# Patient Record
Sex: Female | Born: 1995 | Race: Black or African American | Hispanic: No | Marital: Married | Smoking: Never smoker
Health system: Southern US, Community
[De-identification: ages and names within clinical notes are randomized; demographics above are authoritative.]

## PROBLEM LIST (undated history)

## (undated) ENCOUNTER — Inpatient Hospital Stay: Payer: Self-pay

## (undated) DIAGNOSIS — F329 Major depressive disorder, single episode, unspecified: Secondary | ICD-10-CM

## (undated) DIAGNOSIS — F32A Depression, unspecified: Secondary | ICD-10-CM

## (undated) DIAGNOSIS — F419 Anxiety disorder, unspecified: Secondary | ICD-10-CM

---

## 2016-04-10 ENCOUNTER — Emergency Department
Admission: EM | Admit: 2016-04-10 | Discharge: 2016-04-10 | Disposition: A | Discharge status: Home / Self Care | Payer: Self-pay | Attending: Emergency Medicine | Admitting: Emergency Medicine

## 2016-04-10 ENCOUNTER — Emergency Department: Payer: Self-pay

## 2016-04-10 ENCOUNTER — Encounter: Payer: Self-pay | Admitting: *Deleted

## 2016-04-10 DIAGNOSIS — Z3490 Encounter for supervision of normal pregnancy, unspecified, unspecified trimester: Secondary | ICD-10-CM

## 2016-04-10 DIAGNOSIS — Z3A00 Weeks of gestation of pregnancy not specified: Secondary | ICD-10-CM | POA: Insufficient documentation

## 2016-04-10 DIAGNOSIS — O99280 Endocrine, nutritional and metabolic diseases complicating pregnancy, unspecified trimester: Secondary | ICD-10-CM | POA: Insufficient documentation

## 2016-04-10 DIAGNOSIS — O99519 Diseases of the respiratory system complicating pregnancy, unspecified trimester: Secondary | ICD-10-CM | POA: Insufficient documentation

## 2016-04-10 DIAGNOSIS — E876 Hypokalemia: Secondary | ICD-10-CM | POA: Insufficient documentation

## 2016-04-10 DIAGNOSIS — B9789 Other viral agents as the cause of diseases classified elsewhere: Secondary | ICD-10-CM

## 2016-04-10 DIAGNOSIS — J069 Acute upper respiratory infection, unspecified: Secondary | ICD-10-CM | POA: Insufficient documentation

## 2016-04-10 DIAGNOSIS — R112 Nausea with vomiting, unspecified: Secondary | ICD-10-CM

## 2016-04-10 DIAGNOSIS — O0281 Inappropriate change in quantitative human chorionic gonadotropin (hCG) in early pregnancy: Secondary | ICD-10-CM | POA: Insufficient documentation

## 2016-04-10 LAB — URINALYSIS, COMPLETE (UACMP) WITH MICROSCOPIC
Bacteria, UA: NONE SEEN
Bilirubin Urine: NEGATIVE
GLUCOSE, UA: NEGATIVE mg/dL
HGB URINE DIPSTICK: NEGATIVE
Ketones, ur: NEGATIVE mg/dL
Leukocytes, UA: NEGATIVE
NITRITE: NEGATIVE
Protein, ur: 30 mg/dL — AB
Specific Gravity, Urine: 1.02 (ref 1.005–1.030)
pH: 9 — ABNORMAL HIGH (ref 5.0–8.0)

## 2016-04-10 LAB — BASIC METABOLIC PANEL
Anion gap: 8 (ref 5–15)
BUN: 6 mg/dL (ref 6–20)
CALCIUM: 8.8 mg/dL — AB (ref 8.9–10.3)
CO2: 21 mmol/L — ABNORMAL LOW (ref 22–32)
Chloride: 105 mmol/L (ref 101–111)
Creatinine, Ser: 0.46 mg/dL (ref 0.44–1.00)
GFR calc Af Amer: >60 mL/min (ref >60)
Glucose, Bld: 102 mg/dL — ABNORMAL HIGH (ref 65–99)
POTASSIUM: 3.2 mmol/L — AB (ref 3.5–5.1)
SODIUM: 134 mmol/L — AB (ref 135–145)

## 2016-04-10 LAB — POCT PREGNANCY, URINE: Preg Test, Ur: POSITIVE — AB

## 2016-04-10 LAB — CBC
HCT: 38.3 % (ref 35.0–47.0)
Hemoglobin: 13.4 g/dL (ref 12.0–16.0)
MCH: 30.4 pg (ref 26.0–34.0)
MCHC: 35.1 g/dL (ref 32.0–36.0)
MCV: 86.5 fL (ref 80.0–100.0)
PLATELETS: 327 10*3/uL (ref 150–440)
RBC: 4.42 MIL/uL (ref 3.80–5.20)
RDW: 13.6 % (ref 11.5–14.5)
WBC: 6.6 10*3/uL (ref 3.6–11.0)

## 2016-04-10 LAB — HCG, QUANTITATIVE, PREGNANCY: hCG, Beta Chain, Quant, S: 117397 m[IU]/mL — ABNORMAL HIGH (ref <5)

## 2016-04-10 LAB — TROPONIN I: Troponin I: <0.03 ng/mL (ref <0.03)

## 2016-04-10 MED ORDER — HYDROCODONE-HOMATROPINE 5-1.5 MG/5ML PO SYRP
5.0000 mL | ORAL_SOLUTION | Freq: Four times a day (QID) | ORAL | 0 refills | Status: AC | PRN
Start: 2016-04-10 — End: ?

## 2016-04-10 MED ORDER — ONDANSETRON 4 MG PO TBDP: ORAL_TABLET | ORAL | 0 refills | Status: AC

## 2016-04-10 MED ORDER — POTASSIUM CHLORIDE CRYS ER 20 MEQ PO TBCR: 20.0000 meq | EXTENDED_RELEASE_TABLET | Freq: Every day | ORAL | 0 refills | Status: AC

## 2016-04-10 MED ORDER — POTASSIUM CHLORIDE CRYS ER 20 MEQ PO TBCR
40.0000 meq | EXTENDED_RELEASE_TABLET | Freq: Once | ORAL | Status: AC
  Administered 2016-04-10: 40 meq via ORAL
  Filled 2016-04-10: qty 2

## 2016-04-10 NOTE — Discharge Instructions (Signed)
You have been seen in the Emergency Department (ED) today for a likely viral illness.  Please drink plenty of clear fluids (water, Gatorade, chicken broth, etc).  You may use Tylenol and/or Motrin according to label instructions.  You can alternate between the two without any side effects.   Your pregnancy test was positive and shows that you are early in your pregnancy, probably between 6 and 8 weeks.  These [facility] either at [facility] or at [facility] at the next available opportunity to begin prenatal care.  If you are not already taking prenatal vitamins, please purchase some over-the-counter and start taking them according to label instructions.  Please follow up with your doctor as listed above.  Call your doctor or return to the Emergency Department (ED) if you are unable to tolerate fluids due to vomiting, have worsening trouble breathing, become extremely tired or difficult to awaken, or if you develop any other symptoms that concern you.

## 2016-04-10 NOTE — ED Provider Notes (Signed)
[facility] Emergency Department Provider Note  ____________________________________________   First MD Initiated Contact with Patient 04/10/16 1652     (approximate)  I have reviewed the triage vital signs and the nursing notes.   HISTORY  Chief Complaint Chest Pain and Abdominal Pain    HPI Misty Hudson is a 21 y.o. female with no significant past medical history who presents for evaluation of a variety of symptoms that include URI symptoms (no congestion and persistent cough for about 1 week), chest pain when coughing, and possible pregnancy with persistent nausea, intermittent abdominal cramping, and frequent vomiting.  Her last menstrual period was about 2 months ago.  She last had a Depo-Provera shot about 6 months ago.  She denies any significant abdominal pain and does have occasional cramps.  She has had persistent vomiting for more than a week and nothing particular makes it better or worse.  Regarding the upper respiratory symptoms, she was feeling better after several days of nasal congestion, runny nose, and cough, but the cough has persisted and she is coughing forcefully enough now that she has pain in her chest when she coughs.  It is generally nonproductive.    She denies fever/chills, dysuria, hematuria, lower abdominal pain, vaginal bleeding, vaginal discharge.   No past medical history on file.  There are no active problems to display for this patient.   No past surgical history on file.  Prior to Admission medications   Medication Sig Start Date End Date Taking? Authorizing Provider  HYDROcodone-homatropine (HYCODAN) 5-1.5 MG/5ML syrup Take 5 mLs by mouth every 6 (six) hours as needed for cough. 04/10/16   Loleta Roseory Amelya Mabry, MD  ondansetron (ZOFRAN ODT) 4 MG disintegrating tablet Allow 1-2 tablets to dissolve in your mouth every 8 hours as needed for nausea/vomiting 04/10/16   Loleta Roseory Rukaya Kleinschmidt, MD  potassium chloride SA (KLOR-CON M20) 20 MEQ  tablet Take 1 tablet (20 mEq total) by mouth daily. 04/10/16   Loleta Roseory Brinson Tozzi, MD    Allergies Patient has no known allergies.  No family history on file.  Social History Social History  Substance Use Topics  . Smoking status: Never Smoker  . Smokeless tobacco: Never Used  . Alcohol use No    Review of Systems Constitutional: No fever/chills Eyes: No visual changes. ENT: No sore throat. Cardiovascular: chest pain With cough. Respiratory: Denies shortness of breath. Gastrointestinal: Intermittent abdominal cramping.  Persistent vomiting for at least a week.  No constipation or diarrhea. Genitourinary: Negative for dysuria.  No vaginal bleeding. Musculoskeletal: Negative for back pain. Skin: Negative for rash. Neurological: Negative for headaches, focal weakness or numbness.  10-point ROS otherwise negative.  ____________________________________________   PHYSICAL EXAM:  VITAL SIGNS: ED Triage Vitals  Enc Vitals Group     BP 04/10/16 1630 137/82     Pulse Rate 04/10/16 1630 86     Resp 04/10/16 1630 18     Temp 04/10/16 1630 98.4 F (36.9 C)     Temp Source 04/10/16 1630 Oral     SpO2 04/10/16 1630 98 %     Weight 04/10/16 1624 255 lb (115.7 kg)     Height 04/10/16 1624 5\' 10"  (1.778 m)     Head Circumference --      Peak Flow --      Pain Score 04/10/16 1624 8     Pain Loc --      Pain Edu? --      Excl. in GC? --  Constitutional: Alert and oriented. Well appearing and in no acute distress. Eyes: Conjunctivae are normal. PERRL. EOMI. Head: Atraumatic. Nose: No congestion/rhinnorhea. Mouth/Throat: Mucous membranes are moist. Neck: No stridor.  No meningeal signs.   Cardiovascular: Normal rate, regular rhythm. Good peripheral circulation. Grossly normal heart sounds.  Mild reproducible chest wall tenderness to palpation Respiratory: Normal respiratory effort.  No retractions. Lungs CTAB. Gastrointestinal: Soft and nontender. No distention.  Genitourinary:  deferred, no indication for pelvic exam Musculoskeletal: No lower extremity tenderness nor edema. No gross deformities of extremities. Neurologic:  Normal speech and language. No gross focal neurologic deficits are appreciated.  Skin:  Skin is warm, dry and intact. No rash noted. Psychiatric: Mood and affect are normal. Speech and behavior are normal.  ____________________________________________   LABS (all labs ordered are listed, but only abnormal results are displayed)  Labs Reviewed  BASIC METABOLIC PANEL - Abnormal; Notable for the following:       Result Value   Sodium 134 (*)    Potassium 3.2 (*)    CO2 21 (*)    Glucose, Bld 102 (*)    Calcium 8.8 (*)    All other components within normal limits  URINALYSIS, COMPLETE (UACMP) WITH MICROSCOPIC - Abnormal; Notable for the following:    Color, Urine YELLOW (*)    APPearance CLEAR (*)    pH 9.0 (*)    Protein, ur 30 (*)    Squamous Epithelial / LPF 0-5 (*)    All other components within normal limits  HCG, QUANTITATIVE, PREGNANCY - Abnormal; Notable for the following:    hCG, Beta Chain, Quant, S [id number] (*)    All other components within normal limits  POCT PREGNANCY, URINE - Abnormal; Notable for the following:    Preg Test, Ur POSITIVE (*)    All other components within normal limits  CBC  TROPONIN I   ____________________________________________  EKG  ED ECG REPORT I, Sharde Gover, the attending physician, personally viewed and interpreted this ECG.  Date: 04/10/2016 EKG Time: 16:23 Rate: 86 Rhythm: normal sinus rhythm QRS Axis: normal Intervals: normal ST/T Wave abnormalities: T-wave inversion in lead 3, otherwise unremarkable Conduction Disturbances: none Narrative Interpretation: unremarkable  ____________________________________________  RADIOLOGY   Dg Chest 2 View  Result Date: 04/10/2016 CLINICAL DATA:  21 year old female with chest pain, cough and congestion for 1 week EXAM: CHEST  2 VIEW  COMPARISON:  None. FINDINGS: The lungs are clear and negative for focal airspace consolidation, pulmonary edema or suspicious pulmonary nodule. No pleural effusion or pneumothorax. Cardiac and mediastinal contours are within normal limits. No acute fracture or lytic or blastic osseous lesions. The visualized upper abdominal bowel gas pattern is unremarkable. IMPRESSION: Negative chest x-ray. Electronically Signed   By: Malachy Moan M.D.   On: 04/10/2016 17:24    ____________________________________________   PROCEDURES  Procedure(s) performed:   Procedures   Critical Care performed: No ____________________________________________   INITIAL IMPRESSION / ASSESSMENT AND PLAN / ED COURSE  Pertinent labs & imaging results that were available during my care of the patient were reviewed by me and considered in my medical decision making (see chart for details).      Pulmonary Embolism Rule-out Criteria (PERC rule)                        If YES to ANY of the following, the PERC rule is not satisfied and cannot be used to rule out PE in this patient (consider d-dimer or  imaging depending on pre-test probability).                      If NO to ALL of the following, AND the clinician's pre-test probability is <15%, the [facility] rule is satisfied and there is no need for further workup (including no need to obtain a d-dimer) as the post-test probability of pulmonary embolism is <2%.                      Mnemonic is HAD CLOTS   H - hormone use (exogenous estrogen)      No. A - age > 50                                                 No. D - DVT/PE history                                      No.   C - coughing blood (hemoptysis)                 No. L - leg swelling, unilateral                             No. O - O2 Sat on Room Air < 95%                  No. T - tachycardia (HR ? 100)                         No. S - surgery or trauma, recent                      No.   Based on my  evaluation of the patient, including application of this decision instrument, further testing to evaluate for pulmonary embolism is not indicated at this time. I have discussed this recommendation with the patient who states understanding and agreement with this plan.   The patient has a persistent cough after a viral illness.  Given that she feels like it is getting worse I will obtain a chest x-ray.  She does have a positive pregnancy test and hCG is pending.  However she has no vaginal bleeding and there is no indication for an ultrasound at this time.  I suspect she will need to follow up with an OB provider to establish care, but there is no evidence of any acute or emergent medical issue at this time.  She has slight hypokalemia which is likely the result of all of her recent vomiting and I will give her a supplement as well as a prescription for Zofran.  Clinical Course as of Apr 10 1808  Wed Apr 10, 2016  1732 Normal CXR. DG Chest 2 View [CF]    Clinical Course User Index [CF] Loleta Rose, MD    ____________________________________________  FINAL CLINICAL IMPRESSION(S) / ED DIAGNOSES  Final diagnoses:  Early stage of pregnancy  Viral URI with cough  Non-intractable vomiting with nausea, unspecified vomiting type  Hypokalemia, gastrointestinal losses     MEDICATIONS GIVEN DURING THIS VISIT:  Medications  potassium chloride SA (K-DUR,KLOR-CON) CR tablet 40  mEq (40 mEq Oral Given 04/10/16 1737)     NEW OUTPATIENT MEDICATIONS STARTED DURING THIS VISIT:  New Prescriptions   HYDROCODONE-HOMATROPINE (HYCODAN) 5-1.5 MG/5ML SYRUP    Take 5 mLs by mouth every 6 (six) hours as needed for cough.   ONDANSETRON (ZOFRAN ODT) 4 MG DISINTEGRATING TABLET    Allow 1-2 tablets to dissolve in your mouth every 8 hours as needed for nausea/vomiting   POTASSIUM CHLORIDE SA (KLOR-CON M20) 20 MEQ TABLET    Take 1 tablet (20 mEq total) by mouth daily.    Modified Medications   No medications on  file    Discontinued Medications   No medications on file     Note:  This document was prepared using Dragon voice recognition software and may include unintentional dictation errors.    Loleta Rose, MD 04/10/16 [phone]

## 2016-04-10 NOTE — ED Notes (Signed)
ED Provider at bedside. 

## 2016-04-10 NOTE — ED Notes (Signed)
Pt c/o cough and congestion, CP with coughing. Pt also belives that she may be pregnant due to nausea and abdominal cramping.

## 2016-04-10 NOTE — ED Triage Notes (Signed)
Pt has chest pain with cough for 1 week.  Pt also reports low abd pain with cramping. Pt states I think I may be pregnant.  No vag bleeding.  No dysuria.  Pt alert.

## 2016-04-14 ENCOUNTER — Encounter: Payer: Self-pay | Admitting: Emergency Medicine

## 2016-04-14 ENCOUNTER — Emergency Department
Admission: EM | Admit: 2016-04-14 | Discharge: 2016-04-14 | Disposition: A | Discharge status: Home / Self Care | Payer: Self-pay | Attending: Emergency Medicine | Admitting: Emergency Medicine

## 2016-04-14 DIAGNOSIS — O99519 Diseases of the respiratory system complicating pregnancy, unspecified trimester: Secondary | ICD-10-CM | POA: Insufficient documentation

## 2016-04-14 DIAGNOSIS — H65111 Acute and subacute allergic otitis media (mucoid) (sanguinous) (serous), right ear: Secondary | ICD-10-CM

## 2016-04-14 DIAGNOSIS — H65191 Other acute nonsuppurative otitis media, right ear: Secondary | ICD-10-CM | POA: Insufficient documentation

## 2016-04-14 DIAGNOSIS — Z3A00 Weeks of gestation of pregnancy not specified: Secondary | ICD-10-CM | POA: Insufficient documentation

## 2016-04-14 DIAGNOSIS — Z79899 Other long term (current) drug therapy: Secondary | ICD-10-CM | POA: Insufficient documentation

## 2016-04-14 DIAGNOSIS — J069 Acute upper respiratory infection, unspecified: Secondary | ICD-10-CM | POA: Insufficient documentation

## 2016-04-14 DIAGNOSIS — H6981 Other specified disorders of Eustachian tube, right ear: Secondary | ICD-10-CM

## 2016-04-14 DIAGNOSIS — H6991 Unspecified Eustachian tube disorder, right ear: Secondary | ICD-10-CM | POA: Insufficient documentation

## 2016-04-14 MED ORDER — AMOXICILLIN 875 MG PO TABS: 875.0000 mg | ORAL_TABLET | Freq: Two times a day (BID) | ORAL | 0 refills | Status: AC

## 2016-04-14 MED ORDER — CETIRIZINE HCL 10 MG PO TABS: 10.0000 mg | ORAL_TABLET | Freq: Every day | ORAL | 0 refills | Status: DC

## 2016-04-14 MED ORDER — AMOXICILLIN 500 MG PO CAPS
1000.0000 mg | ORAL_CAPSULE | Freq: Once | ORAL | Status: AC
Start: 2016-04-14 — End: 2016-04-14
  Administered 2016-04-14: 1000 mg via ORAL
  Filled 2016-04-14: qty 2

## 2016-04-14 MED ORDER — FLUTICASONE PROPIONATE 50 MCG/ACT NA SUSP: 1.0000 | Freq: Two times a day (BID) | NASAL | 0 refills | Status: AC

## 2016-04-14 NOTE — ED Triage Notes (Signed)
Patient with complaint of right ear pain that started around 18:00 tonight.

## 2016-04-14 NOTE — ED Provider Notes (Signed)
[facility] Emergency Department Provider Note  ____________________________________________  Time seen: Approximately 10:41 PM  I have reviewed the triage vital signs and the nursing notes.   HISTORY  Chief Complaint Otalgia    HPI Misty Hudson is a 21 y.o. female who presents to emergency department complaining of right ear pain. Patient has had cold-like symptoms over the past week. She reports that tonight she developed sharp right sided ear pain. Patient has had nasal congestion, coughing, mild nausea, vomiting. She was evaluated in the emergency department for these complaints for days prior. Patient is also pregnant. She denies any headache, visual changes, neck pain, chest pain, shortness of breath, and the abdominal pain, vaginal bleeding or discharge.   History reviewed. No pertinent past medical history.  There are no active problems to display for this patient.   History reviewed. No pertinent surgical history.  Prior to Admission medications   Medication Sig Start Date End Date Taking? Authorizing Provider  amoxicillin (AMOXIL) 875 MG tablet Take 1 tablet (875 mg total) by mouth 2 (two) times daily. 04/14/16   Delorise Royals Cuthriell, PA-C  cetirizine (ZYRTEC) 10 MG tablet Take 1 tablet (10 mg total) by mouth daily. 04/14/16   Delorise Royals Cuthriell, PA-C  fluticasone (FLONASE) 50 MCG/ACT nasal spray Place 1 spray into both nostrils 2 (two) times daily. 04/14/16   Delorise Royals Cuthriell, PA-C  HYDROcodone-homatropine (HYCODAN) 5-1.5 MG/5ML syrup Take 5 mLs by mouth every 6 (six) hours as needed for cough. 04/10/16   Loleta Rose, MD  ondansetron (ZOFRAN ODT) 4 MG disintegrating tablet Allow 1-2 tablets to dissolve in your mouth every 8 hours as needed for nausea/vomiting 04/10/16   Loleta Rose, MD  potassium chloride SA (KLOR-CON M20) 20 MEQ tablet Take 1 tablet (20 mEq total) by mouth daily. 04/10/16   Loleta Rose, MD    Allergies Patient has no known  allergies.  No family history on file.  Social History Social History  Substance Use Topics  . Smoking status: Never Smoker  . Smokeless tobacco: Never Used  . Alcohol use No     Review of Systems  Constitutional: No fever/chills Eyes: No visual changes. No discharge ENT: Positive for nasal congestion and right-sided ear pain. Cardiovascular: no chest pain. Respiratory: Positive cough. No SOB. Gastrointestinal: No abdominal pain.  No nausea, no vomiting today.  No diarrhea.  No constipation. Musculoskeletal: Negative for musculoskeletal pain. Skin: Negative for rash, abrasions, lacerations, ecchymosis. Neurological: Negative for headaches, focal weakness or numbness. 10-point ROS otherwise negative.  ____________________________________________   PHYSICAL EXAM:  VITAL SIGNS: ED Triage Vitals  Enc Vitals Group     BP 04/14/16 2002 128/85     Pulse Rate 04/14/16 2002 (!) 105     Resp 04/14/16 2002 18     Temp 04/14/16 2002 98.4 F (36.9 C)     Temp Source 04/14/16 2002 Oral     SpO2 04/14/16 2002 100 %     Weight 04/14/16 1957 255 lb (115.7 kg)     Height 04/14/16 1957 5\' 10"  (1.778 m)     Head Circumference --      Peak Flow --      Pain Score 04/14/16 1957 10     Pain Loc --      Pain Edu? --      Excl. in GC? --      Constitutional: Alert and oriented. Well appearing and in no acute distress. Eyes: Conjunctivae are normal. PERRL. EOMI. Head: Atraumatic. ENT:  Ears: EACs unremarkable bilaterally. TM on right is erythematous, bulging, mucoid air-fluid level. TM on left is unremarkable.      Nose: Moderate clear congestion/rhinnorhea.      Mouth/Throat: Mucous membranes are moist. Oropharynx is mildly erythematous but not edematous. Uvula was midline. Tonsils are mildly erythematous and edematous. Neck: No stridor. Neck is supple with full range of motion Hematological/Lymphatic/Immunilogical: No cervical lymphadenopathy Cardiovascular: Normal rate,  regular rhythm. Normal S1 and S2.  Good peripheral circulation. Respiratory: Normal respiratory effort without tachypnea or retractions. Lungs CTAB. Good air entry to the bases with no decreased or absent breath sounds. Gastrointestinal: Bowel sounds 4 quadrants. Soft and nontender to palpation. No guarding or rigidity. No palpable masses. No distention.  Musculoskeletal: Full range of motion to all extremities. No gross deformities appreciated. Neurologic:  Normal speech and language. No gross focal neurologic deficits are appreciated.  Skin:  Skin is warm, dry and intact. No rash noted. Psychiatric: Mood and affect are normal. Speech and behavior are normal. Patient exhibits appropriate insight and judgement.   ____________________________________________   LABS (all labs ordered are listed, but only abnormal results are displayed)  Labs Reviewed - No data to display ____________________________________________  EKG   ____________________________________________  RADIOLOGY   No results found.  ____________________________________________    PROCEDURES  Procedure(s) performed:    Procedures    Medications  amoxicillin (AMOXIL) capsule 1,000 mg (1,000 mg Oral Given 04/14/16 2056)     ____________________________________________   INITIAL IMPRESSION / ASSESSMENT AND PLAN / ED COURSE  Pertinent labs & imaging results that were available during my care of the patient were reviewed by me and considered in my medical decision making (see chart for details).  Review of the [place] CSRS was performed in accordance of the [facility] prior to dispensing any controlled drugs.     Patient's diagnosis is consistent with Right-sided otitis media likely secondary to eustachian tube dysfunction from her viral upper respiratory tract infection.. Patient will be discharged home with prescriptions for antibiotics, Flonase, Zyrtec. Patient is to follow up with primary care/OB/GYN as needed  or otherwise directed. Patient is given ED precautions to return to the ED for any worsening or new symptoms.     ____________________________________________  FINAL CLINICAL IMPRESSION(S) / ED DIAGNOSES  Final diagnoses:  Acute mucoid otitis media of right ear  Eustachian tube dysfunction, right  Viral upper respiratory tract infection      NEW MEDICATIONS STARTED DURING THIS VISIT:  Discharge Medication List as of 04/14/2016  8:17 PM    START taking these medications   Details  amoxicillin (AMOXIL) 875 MG tablet Take 1 tablet (875 mg total) by mouth 2 (two) times daily., Starting Sun 04/14/2016, Print    cetirizine (ZYRTEC) 10 MG tablet Take 1 tablet (10 mg total) by mouth daily., Starting Sun 04/14/2016, Print    fluticasone (FLONASE) 50 MCG/ACT nasal spray Place 1 spray into both nostrils 2 (two) times daily., Starting Sun 04/14/2016, Print            This chart was dictated using voice recognition software/Dragon. Despite best efforts to proofread, errors can occur which can change the meaning. Any change was purely unintentional.    Racheal PatchesJonathan D Cuthriell, PA-C 04/14/16 2245    Emily FilbertJonathan E Williams, MD 04/14/16 2258

## 2016-07-31 ENCOUNTER — Encounter: Payer: Self-pay | Admitting: Emergency Medicine

## 2016-07-31 ENCOUNTER — Emergency Department
Admission: EM | Admit: 2016-07-31 | Discharge: 2016-07-31 | Disposition: A | Discharge status: Home / Self Care | Payer: Self-pay | Attending: Emergency Medicine | Admitting: Emergency Medicine

## 2016-07-31 DIAGNOSIS — O9952 Diseases of the respiratory system complicating childbirth: Secondary | ICD-10-CM | POA: Insufficient documentation

## 2016-07-31 DIAGNOSIS — Z3492 Encounter for supervision of normal pregnancy, unspecified, second trimester: Secondary | ICD-10-CM

## 2016-07-31 DIAGNOSIS — Z3A24 24 weeks gestation of pregnancy: Secondary | ICD-10-CM | POA: Insufficient documentation

## 2016-07-31 DIAGNOSIS — Z79899 Other long term (current) drug therapy: Secondary | ICD-10-CM | POA: Insufficient documentation

## 2016-07-31 DIAGNOSIS — J209 Acute bronchitis, unspecified: Secondary | ICD-10-CM

## 2016-07-31 LAB — CBC
HCT: 37.7 % (ref 35.0–47.0)
HEMOGLOBIN: 13.2 g/dL (ref 12.0–16.0)
MCH: 31.4 pg (ref 26.0–34.0)
MCHC: 35 g/dL (ref 32.0–36.0)
MCV: 89.6 fL (ref 80.0–100.0)
Platelets: 240 10*3/uL (ref 150–440)
RBC: 4.21 MIL/uL (ref 3.80–5.20)
RDW: 14.1 % (ref 11.5–14.5)
WBC: 8.3 10*3/uL (ref 3.6–11.0)

## 2016-07-31 LAB — BASIC METABOLIC PANEL
ANION GAP: 5 (ref 5–15)
BUN: 6 mg/dL (ref 6–20)
CALCIUM: 8.9 mg/dL (ref 8.9–10.3)
CO2: 21 mmol/L — AB (ref 22–32)
Chloride: 108 mmol/L (ref 101–111)
Creatinine, Ser: 0.35 mg/dL — ABNORMAL LOW (ref 0.44–1.00)
GFR calc non Af Amer: >60 mL/min (ref >60)
Glucose, Bld: 91 mg/dL (ref 65–99)
Potassium: 3.8 mmol/L (ref 3.5–5.1)
Sodium: 134 mmol/L — ABNORMAL LOW (ref 135–145)

## 2016-07-31 LAB — TROPONIN I

## 2016-07-31 MED ORDER — ALBUTEROL SULFATE HFA 108 (90 BASE) MCG/ACT IN AERS: 2.0000 | INHALATION_SPRAY | RESPIRATORY_TRACT | 0 refills | Status: AC | PRN

## 2016-07-31 MED ORDER — ACETAMINOPHEN 325 MG PO TABS: 650.0000 mg | ORAL_TABLET | Freq: Four times a day (QID) | ORAL | 0 refills | Status: AC | PRN

## 2016-07-31 NOTE — ED Notes (Signed)
FHT's right lower abd 140

## 2016-07-31 NOTE — ED Provider Notes (Signed)
[facility] Emergency Department Provider Note  ____________________________________________  Time seen: Approximately 7:32 AM  I have reviewed the triage vital signs and the nursing notes.   HISTORY  Chief Complaint Chest Pain    HPI Misty Hudson is a 21 y.o. female who complains of central chest discomfort since yesterday, associated with cough which is slightly productive of sputum. She also had some chills last night. No fever. No shortness of breath. Not able to specifically describe the quality of the pain, but she reports that it is not sharp. It is not pleuritic. Nonradiating. No aggravating or alleviating factors. Mild to moderate intensity.  Also has some nasal congestion. Also has sore throat. 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  Patient's [redacted] weeks pregnant, prenatal care at [facility]. No complications. Taking prenatal vitamins.       History reviewed. No pertinent past medical history.   There are no active problems to display for this patient.    History reviewed. No pertinent surgical history.   Prior to Admission medications   Medication Sig Start Date End Date Taking? Authorizing Provider  acetaminophen (TYLENOL) 325 MG tablet Take 2 tablets (650 mg total) by mouth every 6 (six) hours as needed. 07/31/16   Sharman Cheek, MD  albuterol (PROVENTIL HFA) 108 [phone] Base) MCG/ACT inhaler Inhale 2 puffs into the lungs every 4 (four) hours as needed for wheezing or shortness of breath. 07/31/16   Sharman Cheek, MD  amoxicillin (AMOXIL) 875 MG tablet Take 1 tablet (875 mg total) by mouth 2 (two) times daily. 04/14/16   Cuthriell, Delorise Royals, PA-C  cetirizine (ZYRTEC) 10 MG tablet Take 1 tablet (10 mg total) by mouth daily. 04/14/16   Cuthriell, Delorise Royals, PA-C  fluticasone (FLONASE) 50 MCG/ACT nasal spray Place 1 spray into both nostrils 2 (two) times daily.  04/14/16   Cuthriell, Delorise Royals, PA-C  HYDROcodone-homatropine (HYCODAN) 5-1.5 MG/5ML syrup Take 5 mLs by mouth every 6 (six) hours as needed for cough. 04/10/16   Loleta Rose, MD  ondansetron (ZOFRAN ODT) 4 MG disintegrating tablet Allow 1-2 tablets to dissolve in your mouth every 8 hours as needed for nausea/vomiting 04/10/16   Loleta Rose, MD  potassium chloride SA (KLOR-CON M20) 20 MEQ tablet Take 1 tablet (20 mEq total) by mouth daily. 04/10/16   Loleta Rose, MD     Allergies Patient has no known allergies.   No family history on file.  Social History Social History  Substance Use Topics  . Smoking status: Never Smoker  . Smokeless tobacco: Never Used  . Alcohol use No    Review of Systems  Constitutional:   No fever Positive chills.  ENT:    positive sore throat. No rhinorrhea. Cardiovascular:    positive chest pain as above, no syncope. Respiratory:   No dyspnea  positive cough. Gastrointestinal:   Negative for abdominal pain, vomiting and diarrhea.  Musculoskeletal:   Negative for focal pain or swelling All other systems reviewed and are negative except as documented above in ROS and HPI.  ____________________________________________   PHYSICAL EXAM:  VITAL SIGNS: ED Triage Vitals  Enc Vitals Group     BP 07/31/16 0520 115/81     Pulse Rate 07/31/16 0520 88     Resp 07/31/16 0520 20     Temp 07/31/16 0520 98.5 F (36.9 C)     Temp Source 07/31/16 0520 Oral  SpO2 07/31/16 0520 99 %     Weight 07/31/16 0518 260 lb (117.9 kg)     Height 07/31/16 0518 5\' 10"  (1.778 m)     Head Circumference --      Peak Flow --      Pain Score 07/31/16 0518 8     Pain Loc --      Pain Edu? --      Excl. in GC? --     Vital signs reviewed, nursing assessments reviewed.   Constitutional:   Alert and oriented. Well appearing and in no distress. Eyes:   No scleral icterus.  EOMI. No nystagmus. No conjunctival pallor. PERRL. ENT   Head:   Normocephalic and  atraumatic.   Nose:   Positive nasal congestion.    Mouth/Throat:   MMM, no pharyngeal erythema. Enlarged tonsils without peritonsillar mass.    Neck:   No meningismus. Full ROM Hematological/Lymphatic/Immunilogical:   No cervical lymphadenopathy. Cardiovascular:   RRR. Symmetric bilateral radial and DP pulses.  No murmurs.  Respiratory:   Normal respiratory effort without tachypnea/retractionsCoarse breath sounds diffusely with end expiratory wheezing. . Gastrointestinal:   Soft and nontender. Non distended. There is no CVA tenderness.  No rebound, rigidity, or guarding. gravid abdomen, size consistent with dates  Genitourinary:   deferred Musculoskeletal:   Normal range of motion in all extremities. No joint effusions.  No lower extremity tenderness.  No edema. negative Homans sign. No calf tenderness. Symmetric Circumference.  Neurologic:   Normal speech and language.  Motor grossly intact. No gross focal neurologic deficits are appreciated.  Skin:    Skin is warm, dry and intact. No rash noted.  No petechiae, purpura, or bullae.  ____________________________________________    LABS (pertinent positives/negatives) (all labs ordered are listed, but only abnormal results are displayed) Labs Reviewed  BASIC METABOLIC PANEL - Abnormal; Notable for the following:       Result Value   Sodium 134 (*)    CO2 21 (*)    Creatinine, Ser 0.35 (*)    All other components within normal limits  CBC  TROPONIN I   ____________________________________________   EKG  Interpreted by me  Date: 07/31/2016  Rate: 86  Rhythm: normal sinus rhythm  QRS Axis: normal  Intervals: normal  ST/T Wave abnormalities: normal  Conduction Disutrbances: none  Narrative Interpretation: unremarkable No evidence of right heart strain.     ____________________________________________    RADIOLOGY  No results  found.  ____________________________________________   PROCEDURES Procedures  ____________________________________________   INITIAL IMPRESSION / ASSESSMENT AND PLAN / ED COURSE  Pertinent labs & imaging results that were available during my care of the patient were reviewed by me and considered in my medical decision making (see chart for details).  Patient well-appearing no acute distress, normal vital signs. Presents with atypical chest discomfort.Considering the patient's symptoms, medical history, and physical examination today, I have low suspicion for ACS, PE, TAD, pneumothorax, carditis, mediastinitis, pneumonia, CHF, or sepsis. PE is very unlikely with her normal vital signs, nonpleuritic pain, no evidence of DVT clinically. No further workup required. Strongly consistent with a viral acute bronchitis. I'll treat with Tylenol and albuterol and close follow-up with her prenatal provider.         ____________________________________________   FINAL CLINICAL IMPRESSION(S) / ED DIAGNOSES  Final diagnoses:  Acute bronchitis, unspecified organism  Second trimester pregnancy      New Prescriptions   ACETAMINOPHEN (TYLENOL) 325 MG TABLET    Take 2 tablets (650  mg total) by mouth every 6 (six) hours as needed.   ALBUTEROL (PROVENTIL HFA) 108 (90 BASE) MCG/ACT INHALER    Inhale 2 puffs into the lungs every 4 (four) hours as needed for wheezing or shortness of breath.     Portions of this note were generated with dragon dictation software. Dictation errors may occur despite best attempts at proofreading.    Sharman Cheek, MD 07/31/16 [phone]

## 2016-07-31 NOTE — ED Triage Notes (Addendum)
Patient ambulatory to triage with steady gait, without difficulty or distress noted; pt reports mid upper CP since yesterday, nonradiating accomp by nonprod cough; 24wks pregnancy, [facility]; G2P1 with no complications

## 2016-09-03 ENCOUNTER — Observation Stay
Admission: EM | Admit: 2016-09-03 | Discharge: 2016-09-03 | Disposition: A | Discharge status: Home / Self Care | Payer: Medicaid Other | Attending: Obstetrics and Gynecology | Admitting: Obstetrics and Gynecology

## 2016-09-03 DIAGNOSIS — O26899 Other specified pregnancy related conditions, unspecified trimester: Secondary | ICD-10-CM

## 2016-09-03 DIAGNOSIS — O4703 False labor before 37 completed weeks of gestation, third trimester: Secondary | ICD-10-CM | POA: Diagnosis not present

## 2016-09-03 DIAGNOSIS — Z3A30 30 weeks gestation of pregnancy: Secondary | ICD-10-CM | POA: Diagnosis not present

## 2016-09-03 DIAGNOSIS — R109 Unspecified abdominal pain: Secondary | ICD-10-CM

## 2016-09-03 NOTE — Discharge Instructions (Signed)
Call or Return to hospital with  Regular contractions Vaginal Bleeding Leaking Fluid Decreased Fetal movement

## 2016-09-03 NOTE — OB Triage Note (Signed)
Pt presents to L&D with c/o cramping today. 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 [facility], where she mentioned the cramping. Pt states she does a lot of bending at work. Pt denies bleeding, leaking of fluid, and reports good fetal movement. EFM applied and explained. Plan to monitor fetal and maternal well being and assess pt complaint.

## 2016-09-04 NOTE — Discharge Summary (Signed)
L&D OB Triage Note  Misty Hudson is a 21 y.o. G2P1001 unassigned ([facility] female at 465w1d, EDD Estimated Date of Delivery: 11/12/16 who presented to triage for complaints of pelvic cramping today.  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 [facility] a [facility] appointment, where she mentioned the cramping. Pt states she does a lot of bending at work. She was evaluated by the nurses with no significant findings for preterm labor.  Patient notes that she thinks it is due to working, as she did not have these problems at during her last pregnancy when she was not working.  Vital signs stable. An NST was performed and has been reviewed by MD. She was treated with PO Tylenol ES.   NST INTERPRETATION: Indications: rule out uterine contractions  Mode: External Baseline Rate (A): 135 bpm Variability: Moderate Accelerations: 15 x 15 Decelerations: None     Contraction Frequency (min): none  Impression: reactive   Plan: NST performed was reviewed and was found to be reactive. She was discharged home with bleeding/ preterm labor precautions.  Continue routine prenatal care. Follow up with OB/GYN as previously scheduled.  Patient given work note for today and tomorrow.  Patient asked if notice could be written to change her activities/schedule at work.  Advised her to discuss this with her OB provider, but modifications could potentially be made.     Hildred LaserAnika Shetara Launer, MD  [facility]

## 2017-02-04 ENCOUNTER — Encounter (HOSPITAL_COMMUNITY): Payer: Self-pay

## 2017-02-07 ENCOUNTER — Other Ambulatory Visit: Payer: Self-pay

## 2017-02-07 ENCOUNTER — Emergency Department
Admission: EM | Admit: 2017-02-07 | Discharge: 2017-02-07 | Disposition: A | Discharge status: Home / Self Care | Payer: Medicaid Other | Attending: Student in an Organized Health Care Education/Training Program | Admitting: Student in an Organized Health Care Education/Training Program

## 2017-02-07 ENCOUNTER — Emergency Department: Payer: Medicaid Other

## 2017-02-07 DIAGNOSIS — M545 Low back pain: Secondary | ICD-10-CM | POA: Diagnosis present

## 2017-02-07 DIAGNOSIS — R102 Pelvic and perineal pain: Secondary | ICD-10-CM | POA: Diagnosis not present

## 2017-02-07 DIAGNOSIS — M5432 Sciatica, left side: Secondary | ICD-10-CM | POA: Diagnosis not present

## 2017-02-07 DIAGNOSIS — R1031 Right lower quadrant pain: Secondary | ICD-10-CM | POA: Diagnosis not present

## 2017-02-07 DIAGNOSIS — Z975 Presence of (intrauterine) contraceptive device: Secondary | ICD-10-CM | POA: Diagnosis not present

## 2017-02-07 DIAGNOSIS — Z79899 Other long term (current) drug therapy: Secondary | ICD-10-CM | POA: Diagnosis not present

## 2017-02-07 DIAGNOSIS — M25552 Pain in left hip: Secondary | ICD-10-CM | POA: Diagnosis not present

## 2017-02-07 LAB — COMPREHENSIVE METABOLIC PANEL
ALBUMIN: 4.2 g/dL (ref 3.5–5.0)
ALK PHOS: 84 U/L (ref 38–126)
ALT: 43 U/L (ref 14–54)
AST: 37 U/L (ref 15–41)
Anion gap: 9 (ref 5–15)
BILIRUBIN TOTAL: 0.3 mg/dL (ref 0.3–1.2)
BUN: 16 mg/dL (ref 6–20)
CALCIUM: 9.3 mg/dL (ref 8.9–10.3)
CO2: 24 mmol/L (ref 22–32)
CREATININE: 0.71 mg/dL (ref 0.44–1.00)
Chloride: 103 mmol/L (ref 101–111)
GFR calc Af Amer: >60 mL/min (ref >60)
GLUCOSE: 97 mg/dL (ref 65–99)
POTASSIUM: 3.9 mmol/L (ref 3.5–5.1)
Sodium: 136 mmol/L (ref 135–145)
TOTAL PROTEIN: 8.1 g/dL (ref 6.5–8.1)

## 2017-02-07 LAB — URINALYSIS, COMPLETE (UACMP) WITH MICROSCOPIC
Bacteria, UA: NONE SEEN
Bilirubin Urine: NEGATIVE
Glucose, UA: NEGATIVE mg/dL
Hgb urine dipstick: NEGATIVE
Ketones, ur: NEGATIVE mg/dL
Leukocytes, UA: NEGATIVE
Nitrite: NEGATIVE
Protein, ur: NEGATIVE mg/dL
Specific Gravity, Urine: 1.023 (ref 1.005–1.030)
pH: 6 (ref 5.0–8.0)

## 2017-02-07 LAB — CBC WITH DIFFERENTIAL/PLATELET
BASOS ABS: 0.1 10*3/uL (ref 0–0.1)
Basophils Relative: 1 %
EOS ABS: 0.6 10*3/uL (ref 0–0.7)
EOS PCT: 8 %
HCT: 39.3 % (ref 35.0–47.0)
Hemoglobin: 13.5 g/dL (ref 12.0–16.0)
LYMPHS PCT: 27 %
Lymphs Abs: 1.8 10*3/uL (ref 1.0–3.6)
MCH: 31.3 pg (ref 26.0–34.0)
MCHC: 34.4 g/dL (ref 32.0–36.0)
MCV: 91.3 fL (ref 80.0–100.0)
Monocytes Absolute: 0.5 10*3/uL (ref 0.2–0.9)
Monocytes Relative: 8 %
Neutro Abs: 3.7 10*3/uL (ref 1.4–6.5)
Neutrophils Relative %: 56 %
PLATELETS: 317 10*3/uL (ref 150–440)
RBC: 4.31 MIL/uL (ref 3.80–5.20)
RDW: 13.7 % (ref 11.5–14.5)
WBC: 6.6 10*3/uL (ref 3.6–11.0)

## 2017-02-07 LAB — CHLAMYDIA/NGC RT PCR (ARMC ONLY)
CHLAMYDIA TR: NOT DETECTED
N gonorrhoeae: NOT DETECTED

## 2017-02-07 LAB — WET PREP, GENITAL
CLUE CELLS WET PREP: NONE SEEN
Sperm: NONE SEEN
TRICH WET PREP: NONE SEEN
WBC WET PREP: NONE SEEN
Yeast Wet Prep HPF POC: NONE SEEN

## 2017-02-07 LAB — POCT PREGNANCY, URINE: Preg Test, Ur: NEGATIVE

## 2017-02-07 MED ORDER — SODIUM CHLORIDE 0.9 % IV BOLUS (SEPSIS)
1000.0000 mL | Freq: Once | INTRAVENOUS | Status: AC
  Administered 2017-02-07: 1000 mL via INTRAVENOUS

## 2017-02-07 MED ORDER — MELOXICAM 15 MG PO TABS: 15.0000 mg | ORAL_TABLET | Freq: Every day | ORAL | 0 refills | Status: AC

## 2017-02-07 MED ORDER — METHOCARBAMOL 500 MG PO TABS: 500.0000 mg | ORAL_TABLET | Freq: Four times a day (QID) | ORAL | 0 refills | Status: AC

## 2017-02-07 NOTE — ED Provider Notes (Signed)
[facility] Emergency Department Provider Note  ____________________________________________  Time seen: Approximately 3:11 PM  I have reviewed the triage vital signs and the nursing notes.   HISTORY  Chief Complaint Back Pain    HPI Sanya Kobrin is a 22 y.o. female who presents emergency department complaining of multiple medical complaints.  Patient reports that she is having lower back pain with radicular symptoms into the left hip.  Symptoms have been ongoing times 1 week.  Symptoms are sharp and burning.  She is not taking medication for this complaint.  No direct trauma to the area with falls or motor vehicle collision.  Patient denies any urinary symptoms of dysuria, polyuria, hematuria.  Patient does have a history of scoliosis and states that she occasionally will have similar symptoms.  Patient denies any bowel or bladder dysfunction, saddle anesthesia, paresthesias.  Patient is also concerned because she is having right lower quadrant pain and is unable to detect her IUD.  Patient reports that she was pregnant, delivered in October and had an IUD placed in November.  Patient reports when she started to experience right lower quadrant pain she check for IUD and cannot feel the strings.  Patient has not seen any evidence that the IUD has "fallen out."  Patient denies any vaginal bleeding, discharge.  No foul odors.  Patient denies any fevers or chills, nausea vomiting, diarrhea or constipation.  No urinary symptoms.  History reviewed. No pertinent past medical history.  Patient Active Problem List   Diagnosis Date Noted  . Labor and delivery, indication for care 09/03/2016    History reviewed. No pertinent surgical history.  Prior to Admission medications   Medication Sig Start Date End Date Taking? Authorizing Provider  loratadine (CLARITIN) 10 MG tablet Take 10 mg by mouth at bedtime.   Yes [provider]  traZODone (DESYREL) 50 MG tablet  Take 50 mg by mouth at bedtime. 01/16/17  Yes [provider]  acetaminophen (TYLENOL) 325 MG tablet Take 2 tablets (650 mg total) by mouth every 6 (six) hours as needed. 07/31/16   Sharman Cheek, MD  albuterol (PROVENTIL HFA) 108 (90 Base) MCG/ACT inhaler Inhale 2 puffs into the lungs every 4 (four) hours as needed for wheezing or shortness of breath. Patient not taking: Reported on 02/07/2017 07/31/16   Sharman Cheek, MD  amoxicillin (AMOXIL) 875 MG tablet Take 1 tablet (875 mg total) by mouth 2 (two) times daily. Patient not taking: Reported on 02/07/2017 04/14/16   Koby Pickup, Delorise Royals, PA-C  fluticasone (FLONASE) 50 MCG/ACT nasal spray Place 1 spray into both nostrils 2 (two) times daily. Patient not taking: Reported on 02/07/2017 04/14/16   Yasuko Lapage, Delorise Royals, PA-C  HYDROcodone-homatropine (HYCODAN) 5-1.5 MG/5ML syrup Take 5 mLs by mouth every 6 (six) hours as needed for cough. Patient not taking: Reported on 02/07/2017 04/10/16   Loleta Rose, MD  meloxicam (MOBIC) 15 MG tablet Take 1 tablet (15 mg total) by mouth daily. 02/07/17   Everline Mahaffy, Delorise Royals, PA-C  methocarbamol (ROBAXIN) 500 MG tablet Take 1 tablet (500 mg total) by mouth 4 (four) times daily. 02/07/17   Elowyn Raupp, Delorise Royals, PA-C  ondansetron (ZOFRAN ODT) 4 MG disintegrating tablet Allow 1-2 tablets to dissolve in your mouth every 8 hours as needed for nausea/vomiting Patient not taking: Reported on 02/07/2017 04/10/16   Loleta Rose, MD  potassium chloride SA (KLOR-CON M20) 20 MEQ tablet Take 1 tablet (20 mEq total) by mouth daily. Patient not taking: Reported on 02/07/2017 04/10/16  Loleta RoseForbach, Cory, MD    Allergies Patient has no known allergies.  No family history on file.  Social History Social History   Tobacco Use  . Smoking status: Never Smoker  . Smokeless tobacco: Never Used  Substance Use Topics  . Alcohol use: No  . Drug use: Not on file     Review of Systems  Constitutional: No fever/chills Eyes: No  visual changes. No discharge ENT: No upper respiratory complaints. Cardiovascular: no chest pain. Respiratory: no cough. No SOB. Gastrointestinal: Positive for right lower quadrant abdominal pain..  No nausea, no vomiting.  No diarrhea.  No constipation. Genitourinary: Negative for dysuria. No hematuria.  IUD in place, patient is unable to feel the strings. Musculoskeletal: From bilateral lower back pain with radicular symptoms into the left hip. Skin: Negative for rash, abrasions, lacerations, ecchymosis. Neurological: Negative for headaches, focal weakness or numbness. 10-point ROS otherwise negative.  ____________________________________________   PHYSICAL EXAM:  VITAL SIGNS: ED Triage Vitals [02/07/17 1418]  Enc Vitals Group     BP (!) 141/83     Pulse Rate (!) 102     Resp 18     Temp 97.7 F (36.5 C)     Temp Source Oral     SpO2 98 %     Weight 253 lb (114.8 kg)     Height 5\' 10"  (1.778 m)     Head Circumference      Peak Flow      Pain Score 8     Pain Loc      Pain Edu?      Excl. in GC?      Constitutional: Alert and oriented. Well appearing and in no acute distress. Eyes: Conjunctivae are normal. PERRL. EOMI. Head: Atraumatic. Neck: No stridor.    Cardiovascular: Normal rate, regular rhythm. Normal S1 and S2.  Good peripheral circulation. Respiratory: Normal respiratory effort without tachypnea or retractions. Lungs CTAB. Good air entry to the bases with no decreased or absent breath sounds. Gastrointestinal: Bowel sounds 4 quadrants. Soft to palpation in all quadrants.  Patient is tender to palpation right lower quadrant.  No rebound tenderness.  No obturator or Rovsing's.. No guarding or rigidity. No palpable masses. No distention. No CVA tenderness. Genitourinary: No external lesions or chancres noted.  Exam under speculum reveals increased vaginal discharge.  No fishy odor.  Cervix is visualized with IUD wires present.  Cervix is otherwise unremarkable.  No  vaginal tears or significant atrophy.  Bimanual exam reveals no cervical motion tenderness.  Palpation of the adnexa reveals no acute abnormality. Musculoskeletal: Full range of motion to all extremities. No gross deformities appreciated.  No visible deformity to spine upon inspection.  Full range of motion to the tender to palpation bilateral paraspinal muscle groups, worse on left than right.  Patient is mildly tender to palpation over the sciatic notch left side.  Negative straight leg raise bilaterally.  Dorsalis pedis pulse intact bilateral lower extremities.  Sensation intact and equal bilateral lower extremities. Neurologic:  Normal speech and language. No gross focal neurologic deficits are appreciated.  Skin:  Skin is warm, dry and intact. No rash noted. Psychiatric: Mood and affect are normal. Speech and behavior are normal. Patient exhibits appropriate insight and judgement.   ____________________________________________   LABS (all labs ordered are listed, but only abnormal results are displayed)  Labs Reviewed  URINALYSIS, COMPLETE (UACMP) WITH MICROSCOPIC - Abnormal; Notable for the following components:      Result Value   Color,  Urine YELLOW (*)    APPearance CLEAR (*)    Squamous Epithelial / LPF 0-5 (*)    All other components within normal limits  WET PREP, GENITAL  CHLAMYDIA/NGC RT PCR ([facility] ONLY)  COMPREHENSIVE METABOLIC PANEL  CBC WITH DIFFERENTIAL/PLATELET  POCT PREGNANCY, URINE  POC URINE PREG, ED   ____________________________________________  EKG   ____________________________________________  RADIOLOGY Festus Barren Juanna Pudlo, personally viewed and evaluated these images as part of my medical decision making, as well as reviewing the written report by the radiologist.  US Pelvic Doppler (torsion R/o Or Mass Arterial Flow)  Result Date: 02/07/2017 CLINICAL DATA:  Right-sided pelvic pain for several days. Clinical suspicion for ovarian torsion.  IUD. EXAM: TRANSABDOMINAL AND TRANSVAGINAL ULTRASOUND OF PELVIS DOPPLER ULTRASOUND OF OVARIES TECHNIQUE: Both transabdominal and transvaginal ultrasound examinations of the pelvis were performed. Transabdominal technique was performed for global imaging of the pelvis including uterus, ovaries, adnexal regions, and pelvic cul-de-sac. It was necessary to proceed with endovaginal exam following the transabdominal exam to visualize the IUD and ovaries. Color and duplex Doppler ultrasound was utilized to evaluate blood flow to the ovaries. COMPARISON:  None. FINDINGS: Uterus Measurements: 9.4 x 5.4 x 5.4 cm. No fibroids or other mass visualized. Endometrium Thickness: 10 mm.  IUD visualized within the endometrial cavity. Right ovary Measurements: 3.1 x 2.0 x 1.9 cm. Small corpus luteum cyst noted. Otherwise normal appearance/no adnexal mass. Left ovary Measurements: 3.8 x 1.2 x 1.0 cm. Normal appearance/no adnexal mass. Pulsed Doppler evaluation of both ovaries demonstrates normal low-resistance arterial and venous waveforms. Other findings No abnormal free fluid. IMPRESSION: Normal appearance uterus. IUD visualized within the endometrial cavity. Normal appearance of both ovaries.  No adnexal mass identified. No sonographic evidence for ovarian torsion. Electronically Signed   By: Myles Rosenthal M.D.   On: 02/07/2017 17:09   US Pelvic Complete With Transvaginal  Result Date: 02/07/2017 CLINICAL DATA:  Right-sided pelvic pain for several days. Clinical suspicion for ovarian torsion. IUD. EXAM: TRANSABDOMINAL AND TRANSVAGINAL ULTRASOUND OF PELVIS DOPPLER ULTRASOUND OF OVARIES TECHNIQUE: Both transabdominal and transvaginal ultrasound examinations of the pelvis were performed. Transabdominal technique was performed for global imaging of the pelvis including uterus, ovaries, adnexal regions, and pelvic cul-de-sac. It was necessary to proceed with endovaginal exam following the transabdominal exam to visualize the IUD and  ovaries. Color and duplex Doppler ultrasound was utilized to evaluate blood flow to the ovaries. COMPARISON:  None. FINDINGS: Uterus Measurements: 9.4 x 5.4 x 5.4 cm. No fibroids or other mass visualized. Endometrium Thickness: 10 mm.  IUD visualized within the endometrial cavity. Right ovary Measurements: 3.1 x 2.0 x 1.9 cm. Small corpus luteum cyst noted. Otherwise normal appearance/no adnexal mass. Left ovary Measurements: 3.8 x 1.2 x 1.0 cm. Normal appearance/no adnexal mass. Pulsed Doppler evaluation of both ovaries demonstrates normal low-resistance arterial and venous waveforms. Other findings No abnormal free fluid. IMPRESSION: Normal appearance uterus. IUD visualized within the endometrial cavity. Normal appearance of both ovaries.  No adnexal mass identified. No sonographic evidence for ovarian torsion. Electronically Signed   By: Myles Rosenthal M.D.   On: 02/07/2017 17:09    ____________________________________________    PROCEDURES  Procedure(s) performed:    Procedures    Medications  sodium chloride 0.9 % bolus 1,000 mL (1,000 mLs Intravenous New Bag/Given 02/07/17 1559)     ____________________________________________   INITIAL IMPRESSION / ASSESSMENT AND PLAN / ED COURSE  Pertinent labs & imaging results that were available during my care of the patient were  reviewed by me and considered in my medical decision making (see chart for details).  Review of the [place] CSRS was performed in accordance of the [facility] prior to dispensing any controlled drugs.     Patient's diagnosis is consistent with sciatica on the left side as well as right lower pelvic pain.  Patient presented to the emergency department with multiple complaints.  Patient had symptoms consistent with site.  Patient was also concerned she was having right lower pelvic pain and was unable to feel her IUD strings.  On pelvic exam, IUD strings were visible pelvic exam was reassuring.  Patient was worked up with labs,  ultrasound, wet prep, gonorrhea and chlamydia.  These all returned with reassuring results.  Urinalysis was on at this time, patient will be discharged with instructions to follow-up with primary care or OB/GYN if she continues to experience pelvic pain.. Patient will be discharged home with prescriptions for meloxicam and Robaxin for sciatica.  Patient is given ED precautions to return to the ED for any worsening or new symptoms.     ____________________________________________  FINAL CLINICAL IMPRESSION(S) / ED DIAGNOSES  Final diagnoses:  Pelvic pain  Sciatica of left side      NEW MEDICATIONS STARTED DURING THIS VISIT:  ED Discharge Orders        Ordered    meloxicam (MOBIC) 15 MG tablet  Daily     02/07/17 1752    methocarbamol (ROBAXIN) 500 MG tablet  4 times daily     02/07/17 1752          This chart was dictated using voice recognition software/Dragon. Despite best efforts to proofread, errors can occur which can change the meaning. Any change was purely unintentional.    Racheal Patches, PA-C 02/07/17 1754    Willy Eddy, MD 02/07/17 1850

## 2017-02-07 NOTE — ED Triage Notes (Signed)
Bilateral lower back pain X 1 week. Left hip pain. No injuries. Pain sharp and intermittent; worse at night. No urinary sx. Pt alert and oriented X4, active, cooperative, pt in NAD. RR even and unlabored, color WNL.

## 2017-02-14 ENCOUNTER — Emergency Department
Admission: EM | Admit: 2017-02-14 | Discharge: 2017-02-14 | Discharge status: Against Medical Advice | Payer: Medicaid Other | Attending: Student in an Organized Health Care Education/Training Program | Admitting: Student in an Organized Health Care Education/Training Program

## 2017-02-14 ENCOUNTER — Encounter: Payer: Self-pay | Admitting: Emergency Medicine

## 2017-02-14 ENCOUNTER — Other Ambulatory Visit: Payer: Self-pay

## 2017-02-14 DIAGNOSIS — T839XXA Unspecified complication of genitourinary prosthetic device, implant and graft, initial encounter: Secondary | ICD-10-CM | POA: Insufficient documentation

## 2017-02-14 DIAGNOSIS — N939 Abnormal uterine and vaginal bleeding, unspecified: Secondary | ICD-10-CM | POA: Diagnosis not present

## 2017-02-14 DIAGNOSIS — Y762 Prosthetic and other implants, materials and accessory obstetric and gynecological devices associated with adverse incidents: Secondary | ICD-10-CM | POA: Diagnosis not present

## 2017-02-14 DIAGNOSIS — Z79899 Other long term (current) drug therapy: Secondary | ICD-10-CM | POA: Insufficient documentation

## 2017-02-14 DIAGNOSIS — R102 Pelvic and perineal pain: Secondary | ICD-10-CM | POA: Diagnosis present

## 2017-02-14 LAB — URINALYSIS, ROUTINE W REFLEX MICROSCOPIC
BACTERIA UA: NONE SEEN
BILIRUBIN URINE: NEGATIVE
Glucose, UA: NEGATIVE mg/dL
Ketones, ur: NEGATIVE mg/dL
Leukocytes, UA: NEGATIVE
NITRITE: NEGATIVE
PROTEIN: NEGATIVE mg/dL
Specific Gravity, Urine: 1.025 (ref 1.005–1.030)
pH: 5 (ref 5.0–8.0)

## 2017-02-14 LAB — POCT PREGNANCY, URINE: PREG TEST UR: NEGATIVE

## 2017-02-14 NOTE — ED Triage Notes (Signed)
Pt states she is having vaginal pain, states she has a Mirena IUD and thinks the strings are hanging out of her vagina.  Pt states the symptoms started today, Pt states she is having vaginal bleeding which resembles spotting. Pt states she has not had any sex in 2 weeks.  Pt is also having pelvic pain states she was checked on Friday, but continues to have pain. Pt states the pain is cramping

## 2017-02-14 NOTE — ED Provider Notes (Signed)
[facility] Emergency Department Provider Note  ____________________________________________  Time seen: Approximately 6:12 PM  I have reviewed the triage vital signs and the nursing notes.   HISTORY  Chief Complaint Vaginal Pain; IUD Problem; Pelvic Pain; and Dysuria    HPI Misty Hudson is a 22 y.o. female who presents to the emergency department for evaluation of pelvic pain and cramping.  She had a Mirena IUD placed approximately 2-1/2 months ago.  She started having spotting yesterday that has continued today.  She states that she believes she can feel the IUD strings lower in the vagina than usual.  Patient denies any vaginal discharge, nausea, vomiting, diarrhea, or other symptoms of concern. History reviewed. No pertinent past medical history.  Patient Active Problem List   Diagnosis Date Noted  . Labor and delivery, indication for care 09/03/2016    History reviewed. No pertinent surgical history.  Prior to Admission medications   Medication Sig Start Date End Date Taking? Authorizing Provider  acetaminophen (TYLENOL) 325 MG tablet Take 2 tablets (650 mg total) by mouth every 6 (six) hours as needed. 07/31/16   Sharman CheekStafford, Phillip, MD  albuterol (PROVENTIL HFA) 108 (90 Base) MCG/ACT inhaler Inhale 2 puffs into the lungs every 4 (four) hours as needed for wheezing or shortness of breath. Patient not taking: Reported on 02/07/2017 07/31/16   Sharman CheekStafford, Phillip, MD  amoxicillin (AMOXIL) 875 MG tablet Take 1 tablet (875 mg total) by mouth 2 (two) times daily. Patient not taking: Reported on 02/07/2017 04/14/16   Cuthriell, Delorise RoyalsJonathan D, PA-C  fluticasone (FLONASE) 50 MCG/ACT nasal spray Place 1 spray into both nostrils 2 (two) times daily. Patient not taking: Reported on 02/07/2017 04/14/16   Cuthriell, Delorise RoyalsJonathan D, PA-C  HYDROcodone-homatropine (HYCODAN) 5-1.5 MG/5ML syrup Take 5 mLs by mouth every 6 (six) hours as needed for cough. Patient not taking: Reported on  02/07/2017 04/10/16   Loleta RoseForbach, Cory, MD  loratadine (CLARITIN) 10 MG tablet Take 10 mg by mouth at bedtime.    [provider]  meloxicam (MOBIC) 15 MG tablet Take 1 tablet (15 mg total) by mouth daily. 02/07/17   Cuthriell, Delorise RoyalsJonathan D, PA-C  methocarbamol (ROBAXIN) 500 MG tablet Take 1 tablet (500 mg total) by mouth 4 (four) times daily. 02/07/17   Cuthriell, Delorise RoyalsJonathan D, PA-C  ondansetron (ZOFRAN ODT) 4 MG disintegrating tablet Allow 1-2 tablets to dissolve in your mouth every 8 hours as needed for nausea/vomiting Patient not taking: Reported on 02/07/2017 04/10/16   Loleta RoseForbach, Cory, MD  potassium chloride SA (KLOR-CON M20) 20 MEQ tablet Take 1 tablet (20 mEq total) by mouth daily. Patient not taking: Reported on 02/07/2017 04/10/16   Loleta RoseForbach, Cory, MD  traZODone (DESYREL) 50 MG tablet Take 50 mg by mouth at bedtime. 01/16/17   [provider]    Allergies Patient has no known allergies.  History reviewed. No pertinent family history.  Social History Social History   Tobacco Use  . Smoking status: Never Smoker  . Smokeless tobacco: Never Used  Substance Use Topics  . Alcohol use: No  . Drug use: Not on file    Review of Systems Constitutional: Negative for fever. Respiratory: Negative for shortness of breath or cough. Gastrointestinal: Positive for abdominal pain; negative for nausea , negative for vomiting. Genitourinary: Negative for dysuria , negative for vaginal discharge. Musculoskeletal: Negative for back pain. Skin: Negative for rash, lesion, or wound.. ____________________________________________   PHYSICAL EXAM:  VITAL SIGNS: ED Triage Vitals  Enc Vitals Group  BP 02/14/17 1532 129/68     Pulse Rate 02/14/17 1532 65     Resp 02/14/17 1532 15     Temp 02/14/17 1532 98.1 F (36.7 C)     Temp Source 02/14/17 1532 Oral     SpO2 02/14/17 1532 100 %     Weight 02/14/17 1533 253 lb (114.8 kg)     Height 02/14/17 1533 5\' 10"  (1.778 m)     Head Circumference --       Peak Flow --      Pain Score 02/14/17 1537 7     Pain Loc --      Pain Edu? --      Excl. in GC? --     Constitutional: Alert and oriented. Well appearing and in no acute distress. Eyes: Conjunctivae are normal. PERRL. EOMI. Head: Atraumatic. Nose: No congestion/rhinnorhea. Mouth/Throat: Mucous membranes are moist. Respiratory: Normal respiratory effort.  No retractions. Gastrointestinal: Diffuse tenderness across the lower abdomen. Genitourinary: Pelvic exam: Speculum exam reveals blood in the vaginal vault.  After clearing, cervix was visualized.  No clotting or vaginal discharge noted.  No specimens were collected.  Adnexal tenderness on the right side.  No cervical motion tenderness.  IUD strings were identified. Musculoskeletal: No extremity tenderness nor edema.  Neurologic:  Normal speech and language. No gross focal neurologic deficits are appreciated. Speech is normal. No gait instability. Skin:  Skin is warm, dry and intact. No rash noted. Psychiatric: Mood and affect are normal. Speech and behavior are normal.  ____________________________________________   LABS (all labs ordered are listed, but only abnormal results are displayed)  Labs Reviewed  URINALYSIS, ROUTINE W REFLEX MICROSCOPIC - Abnormal; Notable for the following components:      Result Value   Color, Urine YELLOW (*)    APPearance CLEAR (*)    Hgb urine dipstick LARGE (*)    Squamous Epithelial / LPF 0-5 (*)    All other components within normal limits  POCT PREGNANCY, URINE  POC URINE PREG, ED   ____________________________________________  RADIOLOGY  Patient refused [place]  ____________________________________________   PROCEDURES  Procedure(s) performed: None  ____________________________________________  22 year old female presenting to the emergency department for evaluation and treatment of lower abdominal cramping consistent with menstrual cycle  pain.  ----------------------------------------- 7:57 PM on 02/14/2017 -----------------------------------------  Patient came out of her room and requested to be discharged. She was advised that she will have to leave AMA if placement of her IUD is not confirmed. She was advised of the risks, including death.  INITIAL IMPRESSION / ASSESSMENT AND PLAN / ED COURSE  Pertinent labs & imaging results that were available during my care of the patient were reviewed by me and considered in my medical decision making (see chart for details).  ____________________________________________   FINAL CLINICAL IMPRESSION(S) / ED DIAGNOSES  Final diagnoses:  IUD complication (HCC)  Vaginal bleeding    Note:  This document was prepared using Dragon voice recognition software and may include unintentional dictation errors.    Chinita Pester, FNP 02/14/17 1959    Willy Eddy, MD 02/14/17 2038

## 2017-02-14 NOTE — ED Notes (Signed)
Patient requesting to leave against medical advice. Patient counseled by provider and this RN of the dangers of refusing further treatment. Patient verbalized understanding of all risks discussed. Patient continues to report she would like to leave AMA. Instructions and follow-up care provided by provider and reviewed by provider and this RN. Patient verbalized understanding of instructions and follow-up care. Patient stable, ambulatory, and in no acute distress at time of discharge.

## 2017-02-14 NOTE — Discharge Instructions (Signed)
If the pain worsens, return to the emergency department.

## 2017-02-22 ENCOUNTER — Encounter: Payer: Self-pay | Admitting: *Deleted

## 2017-02-22 ENCOUNTER — Other Ambulatory Visit: Payer: Self-pay

## 2017-02-22 ENCOUNTER — Emergency Department
Admission: EM | Admit: 2017-02-22 | Discharge: 2017-02-23 | Disposition: A | Discharge status: Home / Self Care | Payer: Medicaid Other | Attending: Emergency Medicine | Admitting: Emergency Medicine

## 2017-02-22 DIAGNOSIS — O99345 Other mental disorders complicating the puerperium: Secondary | ICD-10-CM

## 2017-02-22 DIAGNOSIS — F331 Major depressive disorder, recurrent, moderate: Secondary | ICD-10-CM | POA: Diagnosis not present

## 2017-02-22 DIAGNOSIS — F53 Postpartum depression: Secondary | ICD-10-CM

## 2017-02-22 DIAGNOSIS — F1210 Cannabis abuse, uncomplicated: Secondary | ICD-10-CM | POA: Diagnosis not present

## 2017-02-22 DIAGNOSIS — Y999 Unspecified external cause status: Secondary | ICD-10-CM | POA: Insufficient documentation

## 2017-02-22 DIAGNOSIS — Y929 Unspecified place or not applicable: Secondary | ICD-10-CM | POA: Insufficient documentation

## 2017-02-22 DIAGNOSIS — F329 Major depressive disorder, single episode, unspecified: Secondary | ICD-10-CM | POA: Diagnosis present

## 2017-02-22 DIAGNOSIS — Z79899 Other long term (current) drug therapy: Secondary | ICD-10-CM | POA: Insufficient documentation

## 2017-02-22 DIAGNOSIS — F10920 Alcohol use, unspecified with intoxication, uncomplicated: Secondary | ICD-10-CM | POA: Insufficient documentation

## 2017-02-22 DIAGNOSIS — F4321 Adjustment disorder with depressed mood: Secondary | ICD-10-CM | POA: Diagnosis not present

## 2017-02-22 DIAGNOSIS — Y9389 Activity, other specified: Secondary | ICD-10-CM | POA: Insufficient documentation

## 2017-02-22 HISTORY — DX: Major depressive disorder, single episode, unspecified: F32.9

## 2017-02-22 HISTORY — DX: Depression, unspecified: F32.A

## 2017-02-22 HISTORY — DX: Anxiety disorder, unspecified: F41.9

## 2017-02-22 LAB — CBC
HCT: 45.2 % (ref 35.0–47.0)
Hemoglobin: 15.3 g/dL (ref 12.0–16.0)
MCH: 30.7 pg (ref 26.0–34.0)
MCHC: 33.9 g/dL (ref 32.0–36.0)
MCV: 90.7 fL (ref 80.0–100.0)
PLATELETS: 332 10*3/uL (ref 150–440)
RBC: 4.99 MIL/uL (ref 3.80–5.20)
RDW: 13.2 % (ref 11.5–14.5)
WBC: 6.4 10*3/uL (ref 3.6–11.0)

## 2017-02-22 LAB — COMPREHENSIVE METABOLIC PANEL
ALT: 41 U/L (ref 14–54)
AST: 38 U/L (ref 15–41)
Albumin: 4.6 g/dL (ref 3.5–5.0)
Alkaline Phosphatase: 91 U/L (ref 38–126)
Anion gap: 11 (ref 5–15)
BUN: 12 mg/dL (ref 6–20)
CO2: 21 mmol/L — AB (ref 22–32)
Calcium: 9.1 mg/dL (ref 8.9–10.3)
Chloride: 108 mmol/L (ref 101–111)
Creatinine, Ser: 0.73 mg/dL (ref 0.44–1.00)
GLUCOSE: 143 mg/dL — AB (ref 65–99)
Potassium: 3.5 mmol/L (ref 3.5–5.1)
Sodium: 140 mmol/L (ref 135–145)
TOTAL PROTEIN: 9.2 g/dL — AB (ref 6.5–8.1)
Total Bilirubin: 0.6 mg/dL (ref 0.3–1.2)

## 2017-02-22 LAB — ETHANOL: ALCOHOL ETHYL (B): 176 mg/dL — AB (ref <10)

## 2017-02-22 LAB — POCT PREGNANCY, URINE: PREG TEST UR: NEGATIVE
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
## 2017-02-23 LAB — URINE DRUG SCREEN, QUALITATIVE (ARMC ONLY)
Amphetamines, Ur Screen: NOT DETECTED
BARBITURATES, UR SCREEN: NOT DETECTED
BENZODIAZEPINE, UR SCRN: NOT DETECTED
CANNABINOID 50 NG, UR ~~LOC~~: POSITIVE — AB
Cocaine Metabolite,Ur ~~LOC~~: NOT DETECTED
MDMA (Ecstasy)Ur Screen: NOT DETECTED
Methadone Scn, Ur: NOT DETECTED
Opiate, Ur Screen: NOT DETECTED
PHENCYCLIDINE (PCP) UR S: NOT DETECTED
TRICYCLIC, UR SCREEN: NOT DETECTED
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
## 2017-02-23 NOTE — ED Notes (Signed)

## 2017-02-23 NOTE — ED Notes (Signed)
TTS in to speak with pt.  

## 2017-02-23 NOTE — ED Notes (Signed)
[facility] Camera set up in room and process explained to pt who is understanding.

## 2017-02-23 NOTE — ED Notes (Signed)
Forensic Nurse Melissa notified of pt

## 2017-02-23 NOTE — ED Notes (Signed)
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 Pt also reports social stress due to living with her husbands mother and cultural differences. Pt denies SI/HI AVH at this time.

## 2017-02-23 NOTE — Discharge Instructions (Signed)
Continue all medicines as directed by your doctor.  Return to the ER for worsening symptoms, feelings of hurting yourself or others, or other concerns. °

## 2018-06-24 IMAGING — US US PELVIS COMPLETE TRANSABD/TRANSVAG
1 series · 13 of 25 positions shown · non-contrast
Comparison: None.

CLINICAL DATA: Right-sided pelvic pain for several days. Clinical
suspicion for ovarian torsion. IUD.

EXAM:
TRANSABDOMINAL AND TRANSVAGINAL ULTRASOUND OF PELVIS
DOPPLER ULTRASOUND OF OVARIES
TECHNIQUE: Both transabdominal and transvaginal ultrasound examinations of the
pelvis were performed. Transabdominal technique was performed for
global imaging of the pelvis including uterus, ovaries, adnexal
regions, and pelvic cul-de-sac.
It was necessary to proceed with endovaginal exam following the
transabdominal exam to visualize the IUD and ovaries. Color and
duplex Doppler ultrasound was utilized to evaluate blood flow to the
ovaries.

[Series 1: us pelvis complete transabd/transvag · 0.24mm/px · 70 acquisitions, 13 frames shown]
[im 1/70]
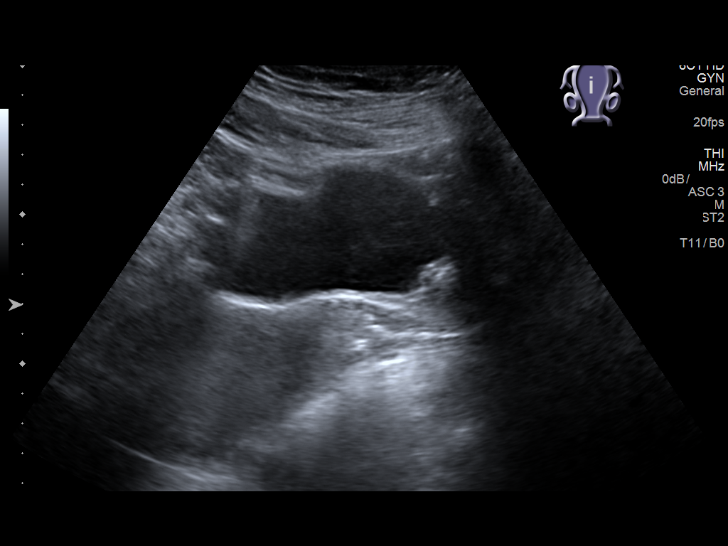
[im 6/70]
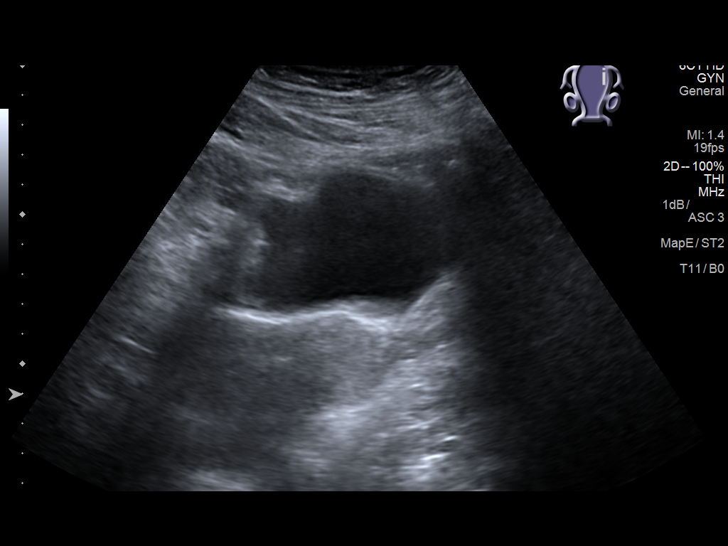
[im 12/70]
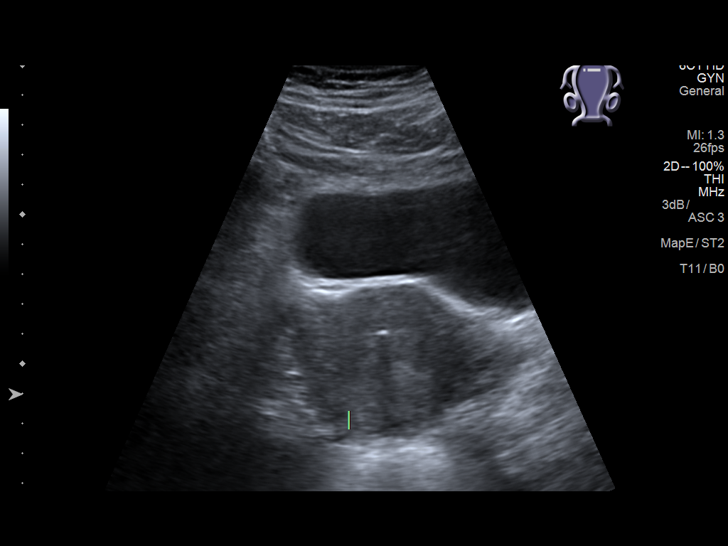
[im 18/70]
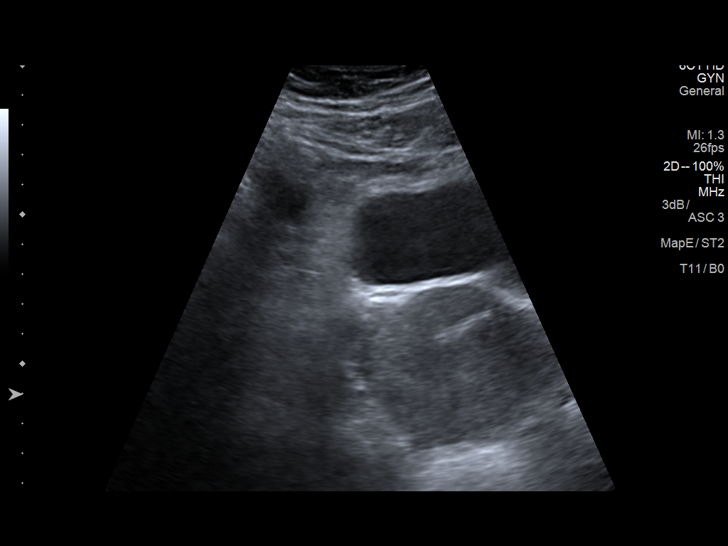
[im 24/70]
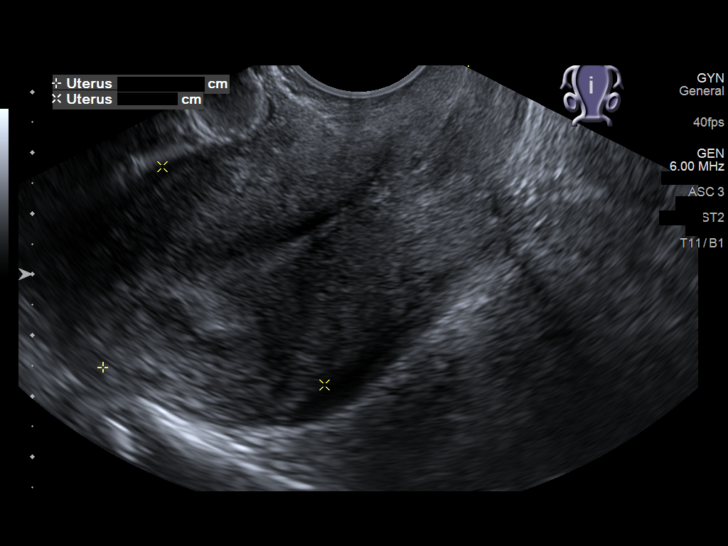
[im 29/70]
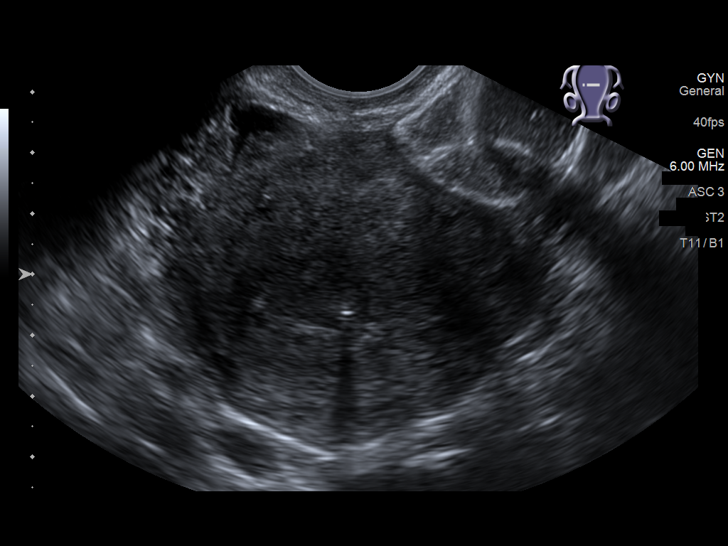
[im 35/70]
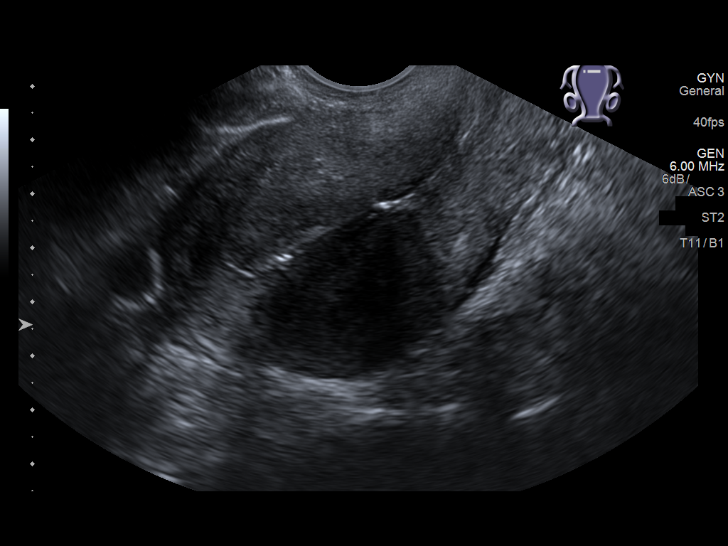
[im 41/70]
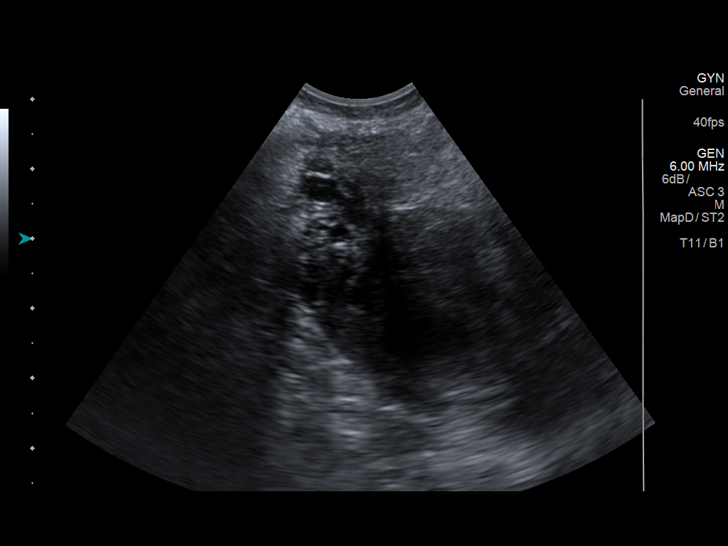
[im 47/70]
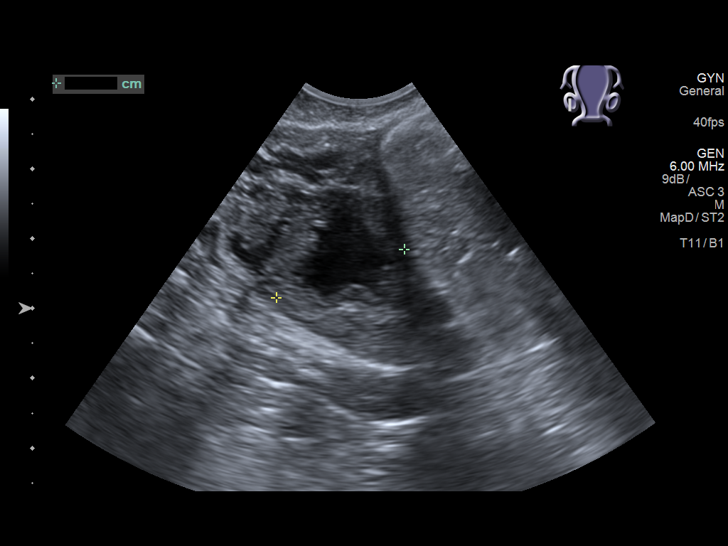
[im 52/70]
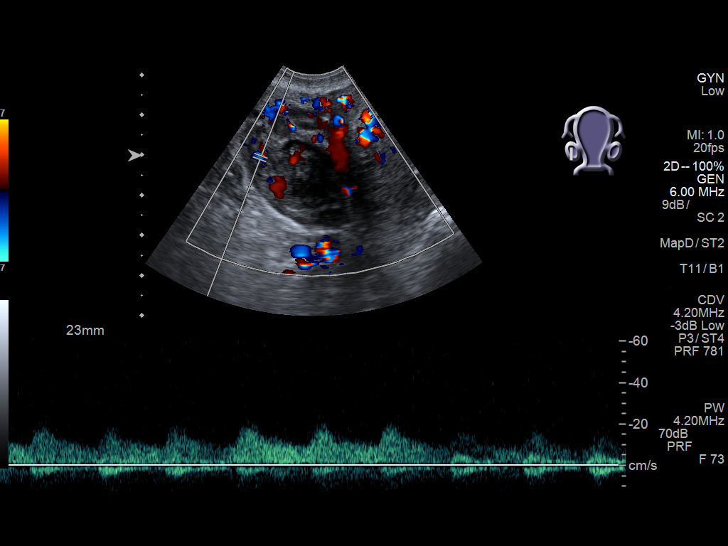
[im 58/70]
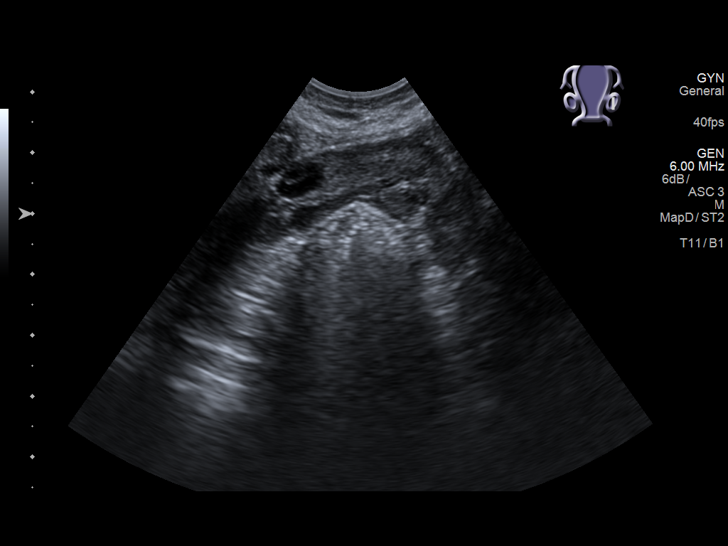
[im 64/70]
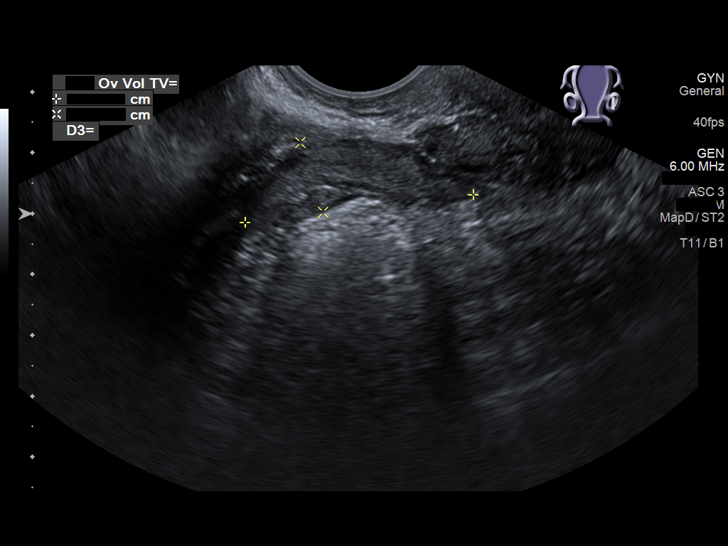
[im 70/70]
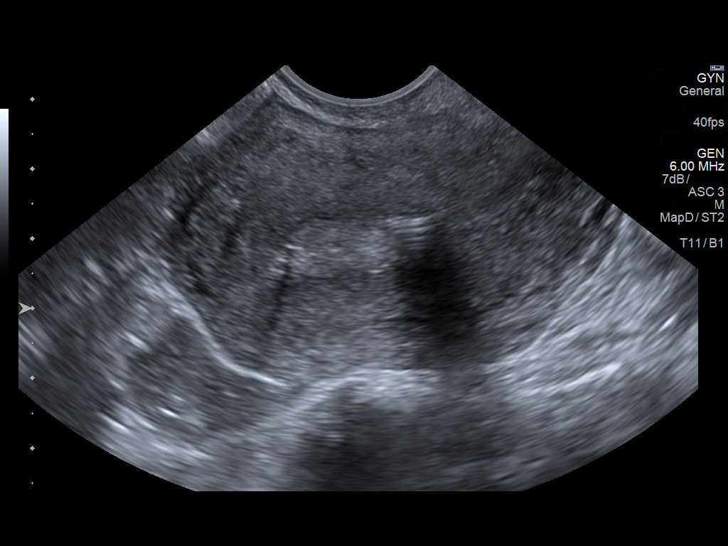

[13 of 25 positions shown; findings below may reference images not displayed]

FINDINGS: Uterus

Measurements: 9.4 x 5.4 x 5.4 cm. No fibroids or other mass
visualized.

Endometrium

Thickness: 10 mm.  IUD visualized within the endometrial cavity.

Right ovary

Measurements: 3.1 x 2.0 x 1.9 cm. Small corpus luteum cyst noted.
Otherwise normal appearance/no adnexal mass.

Left ovary

Measurements: 3.8 x 1.2 x 1.0 cm. Normal appearance/no adnexal mass.

Pulsed Doppler evaluation of both ovaries demonstrates normal
low-resistance arterial and venous waveforms.

Other findings

No abnormal free fluid.
IMPRESSION: Normal appearance uterus. IUD visualized within the endometrial
cavity.

Normal appearance of both ovaries.  No adnexal mass identified.

No sonographic evidence for ovarian torsion.
